# Patient Record
Sex: Male | Born: 1970 | Race: White | Hispanic: No | Marital: Married | Smoking: Never smoker
Health system: Southern US, Community
[De-identification: ages and names within clinical notes are randomized; demographics above are authoritative.]

## PROBLEM LIST (undated history)

## (undated) DIAGNOSIS — I10 Essential (primary) hypertension: Secondary | ICD-10-CM

## (undated) HISTORY — PX: GASTRIC BYPASS: SHX52

---

## 2012-05-30 DIAGNOSIS — L723 Sebaceous cyst: Secondary | ICD-10-CM | POA: Insufficient documentation

## 2012-05-30 DIAGNOSIS — I10 Essential (primary) hypertension: Secondary | ICD-10-CM | POA: Insufficient documentation

## 2012-05-31 ENCOUNTER — Emergency Department (HOSPITAL_BASED_OUTPATIENT_CLINIC_OR_DEPARTMENT_OTHER)
Admission: EM | Admit: 2012-05-31 | Discharge: 2012-05-31 | Disposition: A | Discharge status: Home / Self Care | Payer: PRIVATE HEALTH INSURANCE | Attending: Emergency Medicine | Admitting: Emergency Medicine

## 2012-05-31 ENCOUNTER — Encounter (HOSPITAL_BASED_OUTPATIENT_CLINIC_OR_DEPARTMENT_OTHER): Payer: Self-pay | Admitting: *Deleted

## 2012-05-31 DIAGNOSIS — L089 Local infection of the skin and subcutaneous tissue, unspecified: Secondary | ICD-10-CM

## 2012-05-31 HISTORY — DX: Essential (primary) hypertension: I10

## 2012-05-31 MED ORDER — DOXYCYCLINE HYCLATE 100 MG PO CAPS: 100.0000 mg | ORAL_CAPSULE | Freq: Two times a day (BID) | ORAL | Status: DC

## 2012-05-31 MED ORDER — HYDROCODONE-ACETAMINOPHEN 5-325 MG PO TABS: 1.0000 | ORAL_TABLET | Freq: Four times a day (QID) | ORAL | Status: DC | PRN

## 2012-05-31 MED ORDER — DOXYCYCLINE HYCLATE 100 MG PO TABS
100.0000 mg | ORAL_TABLET | Freq: Once | ORAL | Status: AC
  Administered 2012-05-31: 100 mg via ORAL
  Filled 2012-05-31: qty 1

## 2012-05-31 MED ORDER — LIDOCAINE-EPINEPHRINE 2 %-1:100000 IJ SOLN
20.0000 mL | Freq: Once | INTRAMUSCULAR | Status: AC
  Administered 2012-05-31: 20 mL via INTRADERMAL
  Filled 2012-05-31: qty 1

## 2012-05-31 NOTE — ED Notes (Signed)
Pt reports he has a cyst on his chest that was hit 1 week ago- now red, tender and swollen

## 2012-05-31 NOTE — ED Provider Notes (Signed)
History     CSN: [id number]  Arrival date & time 05/30/12  2352   First MD Initiated Contact with Patient 05/31/12 0216      Chief Complaint  Patient presents with  . Wound Check    (Consider location/radiation/quality/duration/timing/severity/associated sxs/prior treatment) HPI Is a 41 year old male with a history of a epidermoid cyst on his chest just to the right of the sternum. He said it got hit about a week ago and has subsequently become rigid, tender, swollen and inflamed. There is moderate to severe tenderness. He denies systemic symptoms such as fever or chills. He denies any drainage. He was scheduled to have the cyst electively removed next month.  Past Medical History  Diagnosis Date  . Hypertension     Past Surgical History  Procedure Date  . Gastric bypass     No family history on file.  History  Substance Use Topics  . Smoking status: Never Smoker   . Smokeless tobacco: Never Used  . Alcohol Use: No      Review of Systems  All other systems reviewed and are negative.    Allergies  Review of patient's allergies indicates no known allergies.  Home Medications   Current Outpatient Rx  Name Route Sig Dispense Refill  . IBUPROFEN 200 MG PO TABS Oral Take 400 mg by mouth every 6 (six) hours as needed.      BP 170/98  Pulse 72  Temp 98.3 F (36.8 C) (Oral)  Resp 18  SpO2 99%  Physical Exam General: Well-developed, well-nourished male in no acute distress; appearance consistent with age of record HENT: normocephalic, atraumatic Eyes: pupils equal round and reactive to light; extraocular muscles intact Neck: supple Heart: regular rate and rhythm Lungs: clear to auscultation bilaterally Abdomen: soft; nondistended Extremities: No deformity; full range of motion Neurologic: Awake, alert and oriented; motor function intact in all extremities and symmetric; no facial droop Skin: Mass (about 4cm)  in the skin of chest just to the right of the  sternum at the nipple line with tenderness, erythema and fluctuance Psychiatric: Normal mood and affect    ED Course  Procedures (including critical care time)  INCISION AND DRAINAGE Performed by: Hanley Seamen Consent: Verbal consent obtained. Risks and benefits: risks, benefits and alternatives were discussed Type: Infected epidermoid cyst   Body area: Chest  Anesthesia: local infiltration  Local anesthetic: lidocaine 2 % with epinephrine  Anesthetic total: 5 ml  Complexity: complex Blunt dissection to break up loculations, remove as much as possible of cyst wall   Drainage: purulent with curd-like material consistent with epidermoid cyst   Drainage amount: Copious   Packing material: 1/4 in iodoform gauze  Patient tolerance: Patient tolerated the procedure well with no immediate complications.     MDM  Patient was advised to contact his surgeon for reevaluation in 48 hours. If he is unable to do so he was advised to return here for wound check and packing change. He was advised that fragments of the cyst wall may be retained, hence the need for surgical followup as these can cause the cyst to reform.        Hanley Seamen, MD 05/31/12 [phone]

## 2015-08-18 ENCOUNTER — Emergency Department (HOSPITAL_BASED_OUTPATIENT_CLINIC_OR_DEPARTMENT_OTHER): Payer: 59

## 2015-08-18 ENCOUNTER — Emergency Department (HOSPITAL_BASED_OUTPATIENT_CLINIC_OR_DEPARTMENT_OTHER)
Admission: EM | Admit: 2015-08-18 | Discharge: 2015-08-18 | Disposition: A | Discharge status: Home / Self Care | Payer: 59 | Attending: Emergency Medicine | Admitting: Emergency Medicine

## 2015-08-18 ENCOUNTER — Encounter (HOSPITAL_BASED_OUTPATIENT_CLINIC_OR_DEPARTMENT_OTHER): Payer: Self-pay | Admitting: Emergency Medicine

## 2015-08-18 DIAGNOSIS — M10071 Idiopathic gout, right ankle and foot: Secondary | ICD-10-CM | POA: Diagnosis not present

## 2015-08-18 DIAGNOSIS — Z79899 Other long term (current) drug therapy: Secondary | ICD-10-CM | POA: Insufficient documentation

## 2015-08-18 DIAGNOSIS — I10 Essential (primary) hypertension: Secondary | ICD-10-CM | POA: Insufficient documentation

## 2015-08-18 DIAGNOSIS — M79671 Pain in right foot: Secondary | ICD-10-CM | POA: Diagnosis present

## 2015-08-18 DIAGNOSIS — M109 Gout, unspecified: Secondary | ICD-10-CM

## 2015-08-18 MED ORDER — PREDNISONE 10 MG PO TABS: 40.0000 mg | ORAL_TABLET | Freq: Every day | ORAL | Status: AC

## 2015-08-18 MED ORDER — PREDNISONE 50 MG PO TABS
60.0000 mg | ORAL_TABLET | Freq: Once | ORAL | Status: AC
  Administered 2015-08-18: 60 mg via ORAL
  Filled 2015-08-18: qty 1

## 2015-08-18 NOTE — Discharge Instructions (Signed)
Take the prednisone as directed. X-rays of the right foot without any acute injury. Would expect improvement over the next 2-3 days. Return for any new or worse symptoms.

## 2015-08-18 NOTE — ED Provider Notes (Signed)
CSN: [id number]     Arrival date & time 08/18/15  0902 History   First MD Initiated Contact with Patient 08/18/15 0914     Chief Complaint  Patient presents with  . Foot Pain     (Consider location/radiation/quality/duration/timing/severity/associated sxs/prior Treatment) Patient is a 45 y.o. male presenting with lower extremity pain. The history is provided by the patient.  Foot Pain Pertinent negatives include no chest pain, no abdominal pain, no headaches and no shortness of breath.   patient with acute onset of right anterior foot pain started last evening progressed during the night and worse today. Patient noting redness to that area non-throbbing pain. No history of injury. Patient states pain is 8 out of 10. No past history of gout.  Past Medical History  Diagnosis Date  . Hypertension    Past Surgical History  Procedure Laterality Date  . Gastric bypass     History reviewed. No pertinent family history. Social History  Substance Use Topics  . Smoking status: Never Smoker   . Smokeless tobacco: Never Used  . Alcohol Use: No    Review of Systems  Constitutional: Negative for fever.  HENT: Negative for congestion.   Eyes: Negative for visual disturbance.  Respiratory: Negative for shortness of breath.   Cardiovascular: Negative for chest pain.  Gastrointestinal: Negative for nausea, vomiting and abdominal pain.  Genitourinary: Negative for dysuria.  Musculoskeletal: Positive for arthralgias.  Neurological: Negative for headaches.  Hematological: Does not bruise/bleed easily.  Psychiatric/Behavioral: Negative for confusion.      Allergies  Review of patient's allergies indicates no known allergies.  Home Medications   Prior to Admission medications   Medication Sig Start Date End Date Taking? Authorizing Provider  lisinopril-hydrochlorothiazide (PRINZIDE,ZESTORETIC) 20-12.5 MG tablet Take 1 tablet by mouth daily.   Yes Historical Provider, MD  predniSONE  (DELTASONE) 10 MG tablet Take 4 tablets (40 mg total) by mouth daily. 08/18/15   Vanetta Mulders, MD   BP 147/87 mmHg  Pulse 88  Temp(Src) 98.2 F (36.8 C) (Oral)  Resp 20  Ht 5\' 11"  (1.803 m)  Wt 121.564 kg  BMI 37.39 kg/m2  SpO2 100% Physical Exam  Constitutional: He is oriented to person, place, and time. He appears well-developed and well-nourished. No distress.  HENT:  Head: Normocephalic and atraumatic.  Cardiovascular: Normal rate, regular rhythm and normal heart sounds.   No murmur heard. Pulmonary/Chest: Effort normal and breath sounds normal. No respiratory distress.  Abdominal: Soft. Bowel sounds are normal. There is no tenderness.  Musculoskeletal: Normal range of motion. He exhibits tenderness.  Right proximal forefoot anteriorly with an area of redness measuring about 1 x 3 cm. Increased warmth tenderness to palpation no significant edema. Capillary refill to the big toe is 1 second. Sensation intact. No swelling to the ankle or foot. No calf tenderness.  Neurological: He is alert and oriented to person, place, and time. No cranial nerve deficit. He exhibits normal muscle tone. Coordination normal.  Skin: Skin is warm. There is erythema.  Nursing note and vitals reviewed.   ED Course  Procedures (including critical care time) Labs Review Labs Reviewed - No data to display  Imaging Review Dg Foot Complete Right  08/18/2015  CLINICAL DATA:  Right foot pain since last night. No injury. Erythema, pain and tenderness dorsally. EXAM: RIGHT FOOT COMPLETE - 3+ VIEW COMPARISON:  None. FINDINGS: There is no evidence of fracture or dislocation. There is no evidence of arthropathy or other focal bone abnormality. Soft tissues are  unremarkable. IMPRESSION: Negative. Electronically Signed   By: Elberta Fortisaniel  Boyle M.D.   On: 08/18/2015 10:05   I have personally reviewed and evaluated these images and lab results as part of my medical decision-making.   EKG Interpretation None       MDM   Final diagnoses:  Acute gout of right foot, unspecified cause    Redness and swelling and increased warmth on top of the right foot proximal forefoot. Suggestive of gout or pseudogout. X-rays negative. No acute injury. No history of gout in the past. Will treat with prednisone. Patient will return if not improving over the next 2-3 days. Patient will return for any new or worse symptoms.    Vanetta MuldersScott Flornce Record, MD 08/18/15 1029

## 2015-08-18 NOTE — ED Notes (Signed)
Pt reports right foot pain that started last pm, describes pain as on top of foot, denies injury to foot,

## 2016-04-06 ENCOUNTER — Emergency Department (HOSPITAL_BASED_OUTPATIENT_CLINIC_OR_DEPARTMENT_OTHER)
Admission: EM | Admit: 2016-04-06 | Discharge: 2016-04-06 | Disposition: A | Discharge status: Home / Self Care | Payer: 59 | Attending: Emergency Medicine | Admitting: Emergency Medicine

## 2016-04-06 ENCOUNTER — Emergency Department (HOSPITAL_BASED_OUTPATIENT_CLINIC_OR_DEPARTMENT_OTHER): Payer: 59

## 2016-04-06 ENCOUNTER — Encounter (HOSPITAL_BASED_OUTPATIENT_CLINIC_OR_DEPARTMENT_OTHER): Payer: Self-pay | Admitting: Emergency Medicine

## 2016-04-06 DIAGNOSIS — S80811A Abrasion, right lower leg, initial encounter: Secondary | ICD-10-CM | POA: Insufficient documentation

## 2016-04-06 DIAGNOSIS — W11XXXA Fall on and from ladder, initial encounter: Secondary | ICD-10-CM | POA: Insufficient documentation

## 2016-04-06 DIAGNOSIS — S6992XA Unspecified injury of left wrist, hand and finger(s), initial encounter: Secondary | ICD-10-CM | POA: Insufficient documentation

## 2016-04-06 DIAGNOSIS — S20212A Contusion of left front wall of thorax, initial encounter: Secondary | ICD-10-CM | POA: Diagnosis not present

## 2016-04-06 DIAGNOSIS — I10 Essential (primary) hypertension: Secondary | ICD-10-CM | POA: Diagnosis not present

## 2016-04-06 DIAGNOSIS — T148XXA Other injury of unspecified body region, initial encounter: Secondary | ICD-10-CM

## 2016-04-06 DIAGNOSIS — Y999 Unspecified external cause status: Secondary | ICD-10-CM | POA: Diagnosis not present

## 2016-04-06 DIAGNOSIS — Y939 Activity, unspecified: Secondary | ICD-10-CM | POA: Diagnosis not present

## 2016-04-06 DIAGNOSIS — S299XXA Unspecified injury of thorax, initial encounter: Secondary | ICD-10-CM | POA: Diagnosis present

## 2016-04-06 DIAGNOSIS — W19XXXA Unspecified fall, initial encounter: Secondary | ICD-10-CM

## 2016-04-06 DIAGNOSIS — Z79899 Other long term (current) drug therapy: Secondary | ICD-10-CM | POA: Diagnosis not present

## 2016-04-06 DIAGNOSIS — Y929 Unspecified place or not applicable: Secondary | ICD-10-CM | POA: Insufficient documentation

## 2016-04-06 MED ORDER — METHOCARBAMOL 500 MG PO TABS: 1000.0000 mg | ORAL_TABLET | Freq: Three times a day (TID) | ORAL | 0 refills | Status: AC | PRN

## 2016-04-06 MED ORDER — HYDROCODONE-ACETAMINOPHEN 5-325 MG PO TABS: 2.0000 | ORAL_TABLET | ORAL | 0 refills | Status: AC | PRN

## 2016-04-06 MED ORDER — IBUPROFEN 600 MG PO TABS: 600.0000 mg | ORAL_TABLET | Freq: Four times a day (QID) | ORAL | 0 refills | Status: AC | PRN

## 2016-04-06 MED ORDER — KETOROLAC TROMETHAMINE 60 MG/2ML IM SOLN
60.0000 mg | Freq: Once | INTRAMUSCULAR | Status: AC
  Administered 2016-04-06: 60 mg via INTRAMUSCULAR
  Filled 2016-04-06: qty 2

## 2016-04-06 MED ORDER — HYDROCODONE-ACETAMINOPHEN 5-325 MG PO TABS
1.0000 | ORAL_TABLET | Freq: Once | ORAL | Status: AC
  Administered 2016-04-06: 1 via ORAL
  Filled 2016-04-06: qty 1

## 2016-04-06 NOTE — ED Triage Notes (Signed)
Patient states that he fell 10 feet from a ladder. The patient reports that he has pain to his left wrist and shoulder. Abrasions noted to his right shin, left sides rib pain

## 2016-04-06 NOTE — ED Provider Notes (Signed)
[facility] Provider Note   CSN: [id number] Arrival date & time: 04/06/16  1437  By signing my name below, I, Emmanuella Mensah, attest that this documentation has been prepared under the direction and in the presence of Loren Raceravid Tailor Lucking, MD. Electronically Signed: Angelene GiovanniEmmanuella Mensah, ED Scribe. 04/06/16. 4:26 PM.   History   Chief Complaint Chief Complaint  Patient presents with  . Fall   HPI Comments: Greg Howard is a 45 y.o. male with a hx of Hypertension who presents to the Emergency Department complaining of constant moderate left Lower rib pain, left upper arm pain, left hand pain, and multiple abrasions to his anterior RLE s/p fall that occurred 2 hours ago. He explains that he fell from an 8 ft ladder after losing his grip and landing on his left shoulder and left outstretched hand. No LOC or head injuries. He notes that his rib pain worsens and tightens with deep breathing. No alleviating factors noted. Pt has not tried any medications PTA. He reports NKDA. He denies any previous injuries to his hands.   The history is provided by the patient. No language interpreter was used.  Fall  This is a new problem. The current episode started 1 to 2 hours ago. Associated symptoms include chest pain. Pertinent negatives include no abdominal pain, no headaches and no shortness of breath. Nothing aggravates the symptoms. Nothing relieves the symptoms. He has tried nothing for the symptoms.    Past Medical History:  Diagnosis Date  . Hypertension     There are no active problems to display for this patient.   Past Surgical History:  Procedure Laterality Date  . GASTRIC BYPASS         Home Medications    Prior to Admission medications   Medication Sig Start Date End Date Taking? Authorizing Provider  lisinopril-hydrochlorothiazide (PRINZIDE,ZESTORETIC) 20-12.5 MG tablet Take 1 tablet by mouth daily.    Historical Provider, MD  predniSONE (DELTASONE) 10 MG tablet Take  4 tablets (40 mg total) by mouth daily. 08/18/15   Vanetta MuldersScott Zackowski, MD    Family History History reviewed. No pertinent family history.  Social History Social History  Substance Use Topics  . Smoking status: Never Smoker  . Smokeless tobacco: Never Used  . Alcohol use No     Allergies   Review of patient's allergies indicates no known allergies.   Review of Systems Review of Systems  Constitutional: Negative for chills and fever.  HENT: Negative for facial swelling.   Eyes: Negative for visual disturbance.  Respiratory: Negative for chest tightness and shortness of breath.   Cardiovascular: Positive for chest pain. Negative for palpitations and leg swelling.  Gastrointestinal: Negative for abdominal pain, diarrhea, nausea and vomiting.  Genitourinary: Negative for dysuria, flank pain and hematuria.  Musculoskeletal: Positive for arthralgias. Negative for back pain, joint swelling, neck pain and neck stiffness.  Skin: Positive for wound. Negative for rash.  Neurological: Negative for dizziness, syncope, weakness, light-headedness, numbness and headaches.  All other systems reviewed and are negative.   A complete 10 system review of systems was obtained and all systems are negative except as noted in the HPI and PMH.    Physical Exam Updated Vital Signs BP 119/78 (BP Location: Right Arm)   Pulse 90   Temp 99 F (37.2 C) (Oral)   Resp 18   Ht 5\' 10"  (1.778 m)   Wt 271 lb (122.9 kg)   SpO2 (!) 87%   BMI 38.88 kg/m   Physical Exam  Constitutional: He is oriented to person, place, and time. He appears well-developed and well-nourished. No distress.  HENT:  Head: Normocephalic and atraumatic.  Mouth/Throat: Oropharynx is clear and moist. No oropharyngeal exudate.  Midface is stable. No malocclusion.  Eyes: EOM are normal. Pupils are equal, round, and reactive to light.  Neck: Normal range of motion. Neck supple.  No posterior midline cervical tenderness to palpation.    Cardiovascular: Normal rate and regular rhythm.  Exam reveals no gallop and no friction rub.   No murmur heard. Pulmonary/Chest: Effort normal and breath sounds normal. No respiratory distress. He has no wheezes. He has no rales. He exhibits tenderness.  Chest tenderness over the anterior/inferior ribs on the left. Small abrasion noted  Abdominal: Soft. Bowel sounds are normal. There is no tenderness. There is no rebound and no guarding.  Musculoskeletal: Normal range of motion. He exhibits no edema or tenderness.  No midline thoracic or lumbar tenderness. No CVA tenderness. Pelvis is stable. Patient does have some tenderness of the ulnar aspect of the dorsum of the left wrist and hand. There is no obvious swelling or deformity. Full range of motion of the left wrist and left elbow and shoulder. There is some mild tenderness to palpation of the left deltoid but no deformity. 2+ distal pulses in all extremities.  Neurological: He is alert and oriented to person, place, and time.  5/5 motor in all extremities. Equal grip strength. Sensation fully intact.  Skin: Skin is warm and dry. Capillary refill takes less than 2 seconds. No rash noted. He is not diaphoretic. No erythema.  Superficial abrasions noted over the anterior surface of the right pretibial area  Psychiatric: He has a normal mood and affect. His behavior is normal.  Nursing note and vitals reviewed.    ED Treatments / Results  DIAGNOSTIC STUDIES: Oxygen Saturation is 97% on RA, low by my interpretation.    COORDINATION OF CARE: 4:26 PM- Pt advised of plan for treatment and pt agrees. Pt informed of her x-ray results. He will receive a splint. Will provide resources for Ortho follow up. He will also receive rib x-ray for further evaluation.    Labs (all labs ordered are listed, but only abnormal results are displayed) Labs Reviewed - No data to display  EKG  EKG Interpretation None       Radiology Dg Shoulder  Left  Result Date: 04/06/2016 CLINICAL DATA:  Patient states that he fell 7.5 feet from a ladder today. The patient reports that he has pain to his left wrist ulnar side and shoulder EXAM: LEFT SHOULDER - 2+ VIEW COMPARISON:  None. FINDINGS: There is no evidence of fracture or dislocation. There is no evidence of arthropathy or other focal bone abnormality. Soft tissues are unremarkable. IMPRESSION: Negative. Electronically Signed   By: Norva Pavlov M.D.   On: 04/06/2016 15:24   Dg Hand Complete Left  Result Date: 04/06/2016 CLINICAL DATA:  Patient states that he fell 7.5 feet from a ladder today. The patient reports that he has pain to his left wrist ulnar side and shoulder EXAM: LEFT HAND - COMPLETE 3+ VIEW COMPARISON:  None FINDINGS: Along the posterior aspect of the wrist there is a small bone density. This appears well corticated and favors a chronic injury, possibly an old triquetrum fracture. No evidence for acute fracture of the ulna or other carpals. IMPRESSION: 1. Legrand Rams old injury along the dorsal aspect of the carpus. This is felt unlikely to be an acute triquetrum injury.  2. No evidence for acute injury. Electronically Signed   By: Norva Pavlov M.D.   On: 04/06/2016 15:28    Procedures Procedures (including critical care time)  Medications Ordered in ED Medications  ketorolac (TORADOL) injection 60 mg (not administered)  HYDROcodone-acetaminophen (NORCO/VICODIN) 5-325 MG per tablet 1 tablet (not administered)     Initial Impression / Assessment and Plan / ED Course  Loren Racer, MD has reviewed the triage vital signs and the nursing notes.  Pertinent labs & imaging results that were available during my care of the patient were reviewed by me and considered in my medical decision making (see chart for details).  Clinical Course   Questionable chip fracture on the wrist x-ray of the triquetrum. Due to the fact the patient does have some tenderness in this general area  we'll place in a wrist splint and have follow-up with hand surgery. Other x-rays without evidence of fracture.  Return precautions given. Final Clinical Impressions(s) / ED Diagnoses   Final diagnoses:  None    New Prescriptions New Prescriptions   No medications on file  I personally performed the services described in this documentation, which was scribed in my presence. The recorded information has been reviewed and is accurate.      Loren Racer, MD 04/06/16 [phone]

## 2016-04-06 NOTE — Discharge Instructions (Signed)
If you continue to have pain in the hand follow-up with hand surgery for reassessment. Return immediately to the emergency department if you have worsening pain, swelling or for any concerns.

## 2016-04-06 NOTE — ED Notes (Signed)
Patient transported to X-ray 

## 2017-03-22 IMAGING — DX DG HAND COMPLETE 3+V*L*
3 series · 3 of 3 positions shown · non-contrast
Comparison: None

CLINICAL DATA: Patient states that he fell [DATE] feet from a ladder
today. The patient reports that he has pain to his left wrist ulnar
side and shoulder

EXAM:
LEFT HAND - COMPLETE 3+ VIEW

[hand pa]
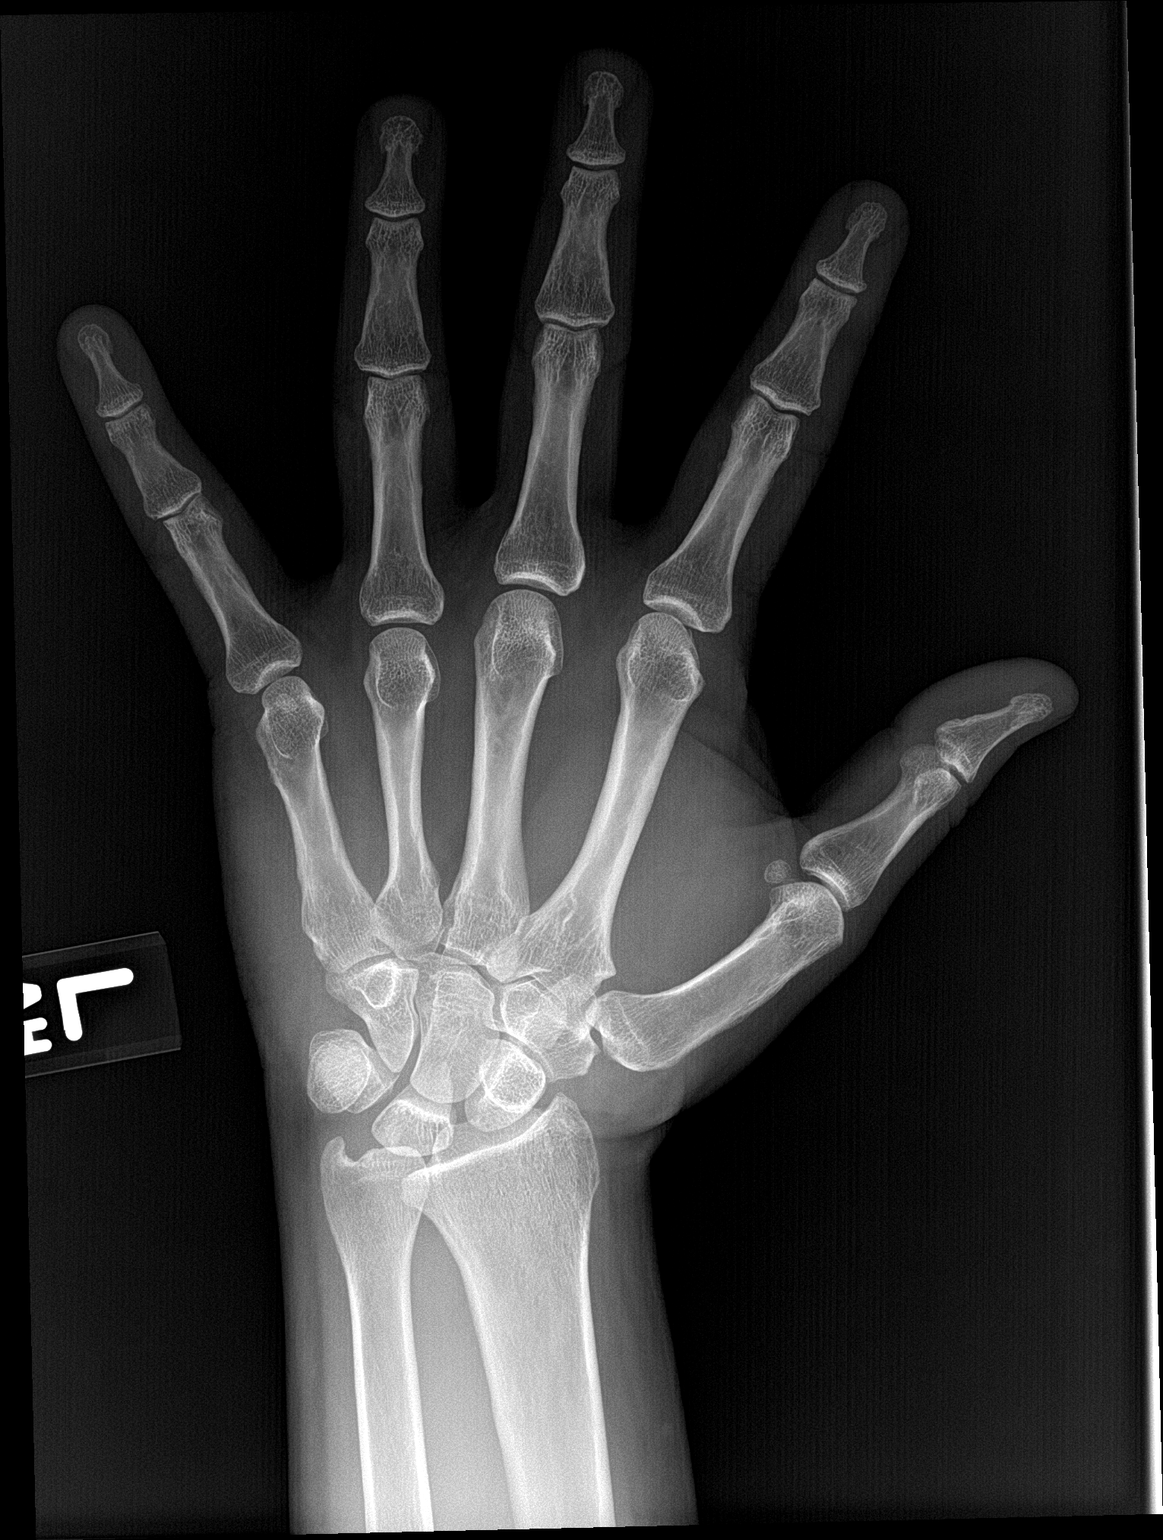

[hand obl]
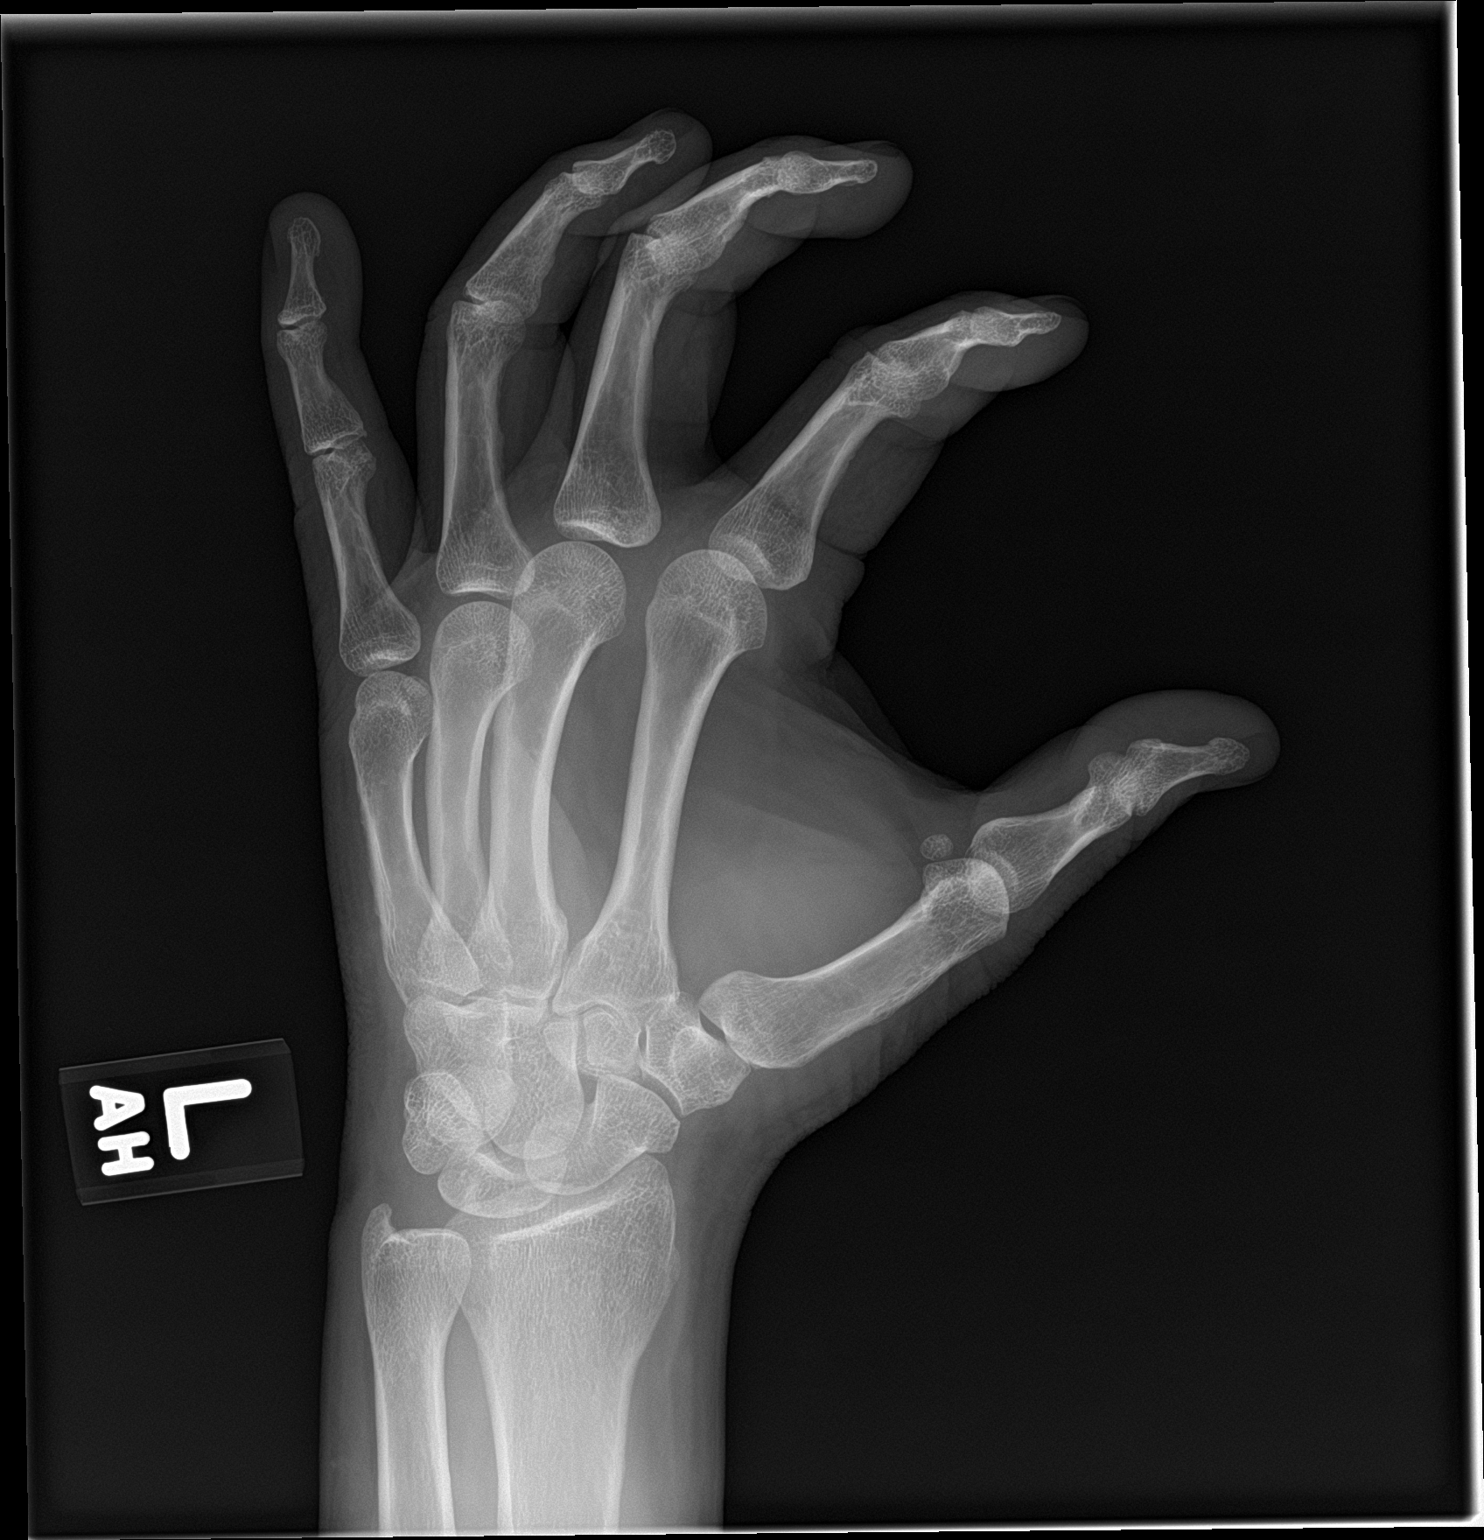

[hand lat]
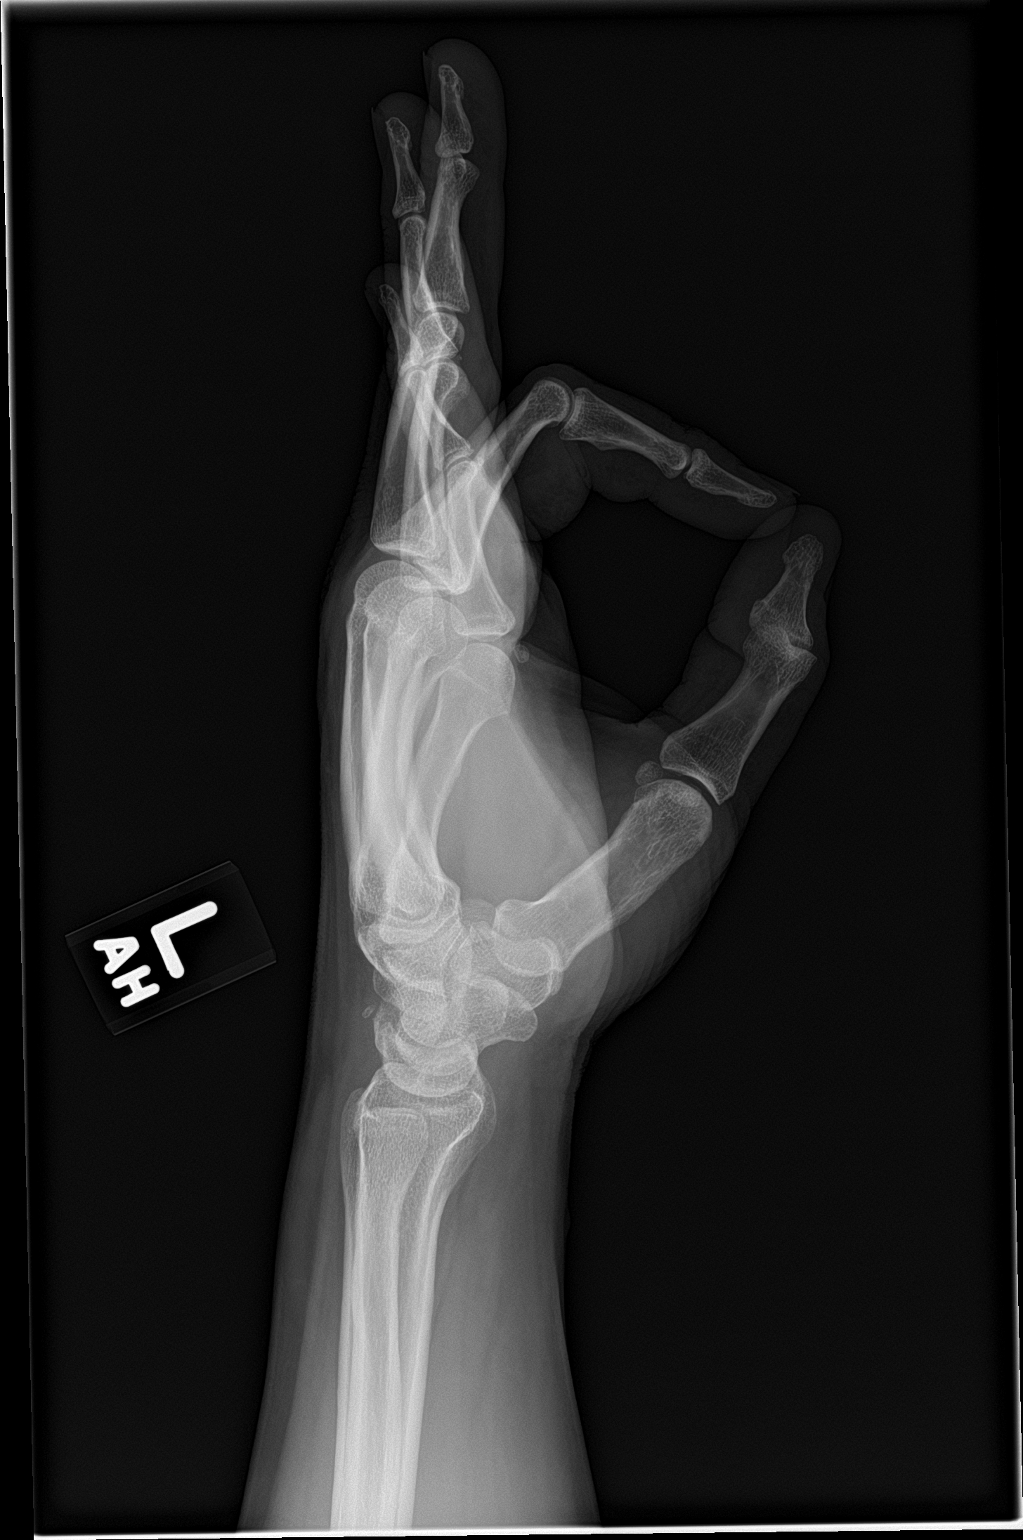

[3 of 3 positions shown; findings below may reference images not displayed]

FINDINGS: Along the posterior aspect of the wrist there is a small bone
density. This appears well corticated and favors a chronic injury,
possibly an old triquetrum fracture. No evidence for acute fracture
of the ulna or other carpals.
IMPRESSION: 1. Favor old injury along the dorsal aspect of the carpus. This is
felt unlikely to be an acute triquetrum injury.
2. No evidence for acute injury.
